# Patient Record
Sex: Female | Born: 1955 | Race: White | Hispanic: No | Marital: Married | Smoking: Current every day smoker
Health system: Southern US, Community
[De-identification: ages and names within clinical notes are randomized; demographics above are authoritative.]

## PROBLEM LIST (undated history)

## (undated) ENCOUNTER — Emergency Department (HOSPITAL_COMMUNITY): Disposition: A | Discharge status: Other Acute Inpatient Hospital | Payer: Self-pay

## (undated) DIAGNOSIS — M542 Cervicalgia: Secondary | ICD-10-CM

## (undated) DIAGNOSIS — J019 Acute sinusitis, unspecified: Secondary | ICD-10-CM

## (undated) DIAGNOSIS — Z789 Other specified health status: Secondary | ICD-10-CM

## (undated) DIAGNOSIS — G4700 Insomnia, unspecified: Secondary | ICD-10-CM

## (undated) DIAGNOSIS — R42 Dizziness and giddiness: Secondary | ICD-10-CM

## (undated) DIAGNOSIS — M545 Low back pain, unspecified: Secondary | ICD-10-CM

## (undated) HISTORY — DX: Dizziness and giddiness: R42

## (undated) HISTORY — DX: Cervicalgia: M54.2

## (undated) HISTORY — DX: Acute sinusitis, unspecified: J01.90

## (undated) HISTORY — DX: Insomnia, unspecified: G47.00

## (undated) HISTORY — DX: Low back pain, unspecified: M54.50

## (undated) HISTORY — DX: Low back pain: M54.5

---

## 1992-07-27 HISTORY — PX: BREAST SURGERY: SHX581

## 1996-07-27 HISTORY — PX: BUNIONETTE EXCISION: SUR500

## 1999-11-22 ENCOUNTER — Emergency Department (HOSPITAL_COMMUNITY): Admission: EM | Admit: 1999-11-22 | Discharge: 1999-11-22 | Payer: Self-pay | Admitting: Emergency Medicine

## 1999-12-17 ENCOUNTER — Other Ambulatory Visit: Admission: RE | Admit: 1999-12-17 | Discharge: 1999-12-17 | Payer: Self-pay | Admitting: Obstetrics & Gynecology

## 2000-12-22 ENCOUNTER — Other Ambulatory Visit: Admission: RE | Admit: 2000-12-22 | Discharge: 2000-12-22 | Payer: Self-pay | Admitting: Obstetrics & Gynecology

## 2002-04-05 ENCOUNTER — Other Ambulatory Visit: Admission: RE | Admit: 2002-04-05 | Discharge: 2002-04-05 | Payer: Self-pay | Admitting: Obstetrics & Gynecology

## 2002-09-08 ENCOUNTER — Ambulatory Visit (HOSPITAL_COMMUNITY): Admission: RE | Admit: 2002-09-08 | Discharge: 2002-09-08 | Payer: Self-pay | Admitting: Obstetrics & Gynecology

## 2003-05-17 ENCOUNTER — Other Ambulatory Visit: Admission: RE | Admit: 2003-05-17 | Discharge: 2003-05-17 | Payer: Self-pay | Admitting: Obstetrics & Gynecology

## 2004-07-03 ENCOUNTER — Other Ambulatory Visit: Admission: RE | Admit: 2004-07-03 | Discharge: 2004-07-03 | Payer: Self-pay | Admitting: Obstetrics & Gynecology

## 2010-03-09 ENCOUNTER — Emergency Department (HOSPITAL_COMMUNITY): Admission: EM | Admit: 2010-03-09 | Discharge: 2010-03-09 | Payer: Self-pay | Admitting: Emergency Medicine

## 2010-12-12 NOTE — Op Note (Signed)
NAME:  Cheyenne Crawford, Cheyenne Crawford                          ACCOUNT NO.:  [id number]   MEDICAL RECORD NO.:  [id number]                   PATIENT TYPE:  AMB   LOCATION:  SDC                                  FACILITY:  [facility]   PHYSICIAN:  Gerrit Friends. Aldona Bar, M.D.                DATE OF BIRTH:  1956-02-25   DATE OF PROCEDURE:  09/08/2002  DATE OF DISCHARGE:                                 OPERATIVE REPORT   PREOPERATIVE DIAGNOSIS:  Desire for permanent elective sterilization.   POSTOPERATIVE DIAGNOSIS:  Desire for permanent elective sterilization.   PROCEDURE:  Laparoscopic tubal coagulation, permanent elective  sterilization.   SURGEON:  Gerrit Friends. Aldona Bar, M.D.   ANESTHESIA:  General endotracheal anesthesia.   HISTORY:  This 55 year old patient is desirous of a permanent elective  sterilization procedure.  She is aware that such procedure is meant to be  100% permanent but, unfortunately, is not 100% perfect as subsequent  pregnancy may result.   According to her wishes, she is now taken to the operating room for such  procedure.   DESCRIPTION OF PROCEDURE:  The patient was taken to the operating room  where, after satisfactory induction of general endotracheal anesthesia, she  was prepped and draped, placed in a modified lithotomy position in the usual  manner for laparoscopy.  Bladder was drained of clear urine with red rubber  catheter in and in and out fashion.   At this time, the patient was adequately draped and the procedure was begun.   A 1 cm subumbilical midline transverse skin incision was made and through  this, the large trocar sleeve was introduced without difficulty.  The trocar  was removed and through the large sleeve, the laparoscope was introduced  with good visualization of  intra-abdominal structures.  At this time, a  pneumoperitoneum was created with approximately three liters of carbon  dioxide gas.  The abdomen and pelvis were inspected.  Liver edges, cervix,  and  diaphragm appeared normal.  Intestinal contents appeared normal.  The  appendix was visualized in its entirety and was normal.  Uterus was small,  normal size, very mobile.  Cul-de-sac was free of any abnormalities and  tubes and ovaries were likewise very normal.   At this time, through a 5 mm suprapubic midline transverse skin incision,  the accessory trocar sleeve was introduced.  Through the accessory sleeve,  once the accessory trocar had been removed, the bipolar coagulating  instrument was introduced after it had been appropriately tested.   At this time the right fallopian tube was identified, traced out to the  fimbriated end for positive identification.  Then the mid portion of the  right fallopian tube and area measuring approximately 2 cm was adequately  coagulated.  Similar procedure was carried out on the left fallopian tube.  At this time, with an adequate portion of each fallopian tube coagulated and  no pathology  appreciated, the procedure was felt to be complete.  The  coagulating instrument was removed from the accessory sleeve and the  accessory sleeve was removed under direct visualization.  The under surface  of the peritoneum was dry.  At this time, the laparoscope was removed and  pneumoperitoneum reduced and large sleeve was removed.  Skin incisions were  closed with 3-0 Vicryl in interrupted subcuticular fashion and sterile  compressive dressing was applied.  The patient, at this time, was awakened  and transported to the recovery room in satisfactory condition, having  tolerated the procedure well.  The estimated blood loss was negligible.  All  counts correct x2.   SPECIMENS:  There were no pathologic specimens.   DISPOSITION:  The patient will be observed in recovery and subsequently  discharged to home with prescriptions for Tylox to use one to two every four  to six hours as needed for severe pain and Compazine rectal suppositories,  25 mg, to use one per  rectum every six to eight hours as needed for nausea  and vomiting.  She will be given specific instructions as to home care and  will be asked to return to the office for follow-up in approximately two to  three weeks' time.                                               Gerrit Friends. Aldona Bar, M.D.    RMW/MEDQ  D:  09/08/2002  T:  09/08/2002  Job:  [id number]

## 2012-08-21 ENCOUNTER — Emergency Department (HOSPITAL_BASED_OUTPATIENT_CLINIC_OR_DEPARTMENT_OTHER)
Admission: EM | Admit: 2012-08-21 | Discharge: 2012-08-21 | Disposition: A | Discharge status: Home / Self Care | Payer: 59 | Attending: Emergency Medicine | Admitting: Emergency Medicine

## 2012-08-21 ENCOUNTER — Emergency Department (HOSPITAL_BASED_OUTPATIENT_CLINIC_OR_DEPARTMENT_OTHER): Payer: 59

## 2012-08-21 ENCOUNTER — Encounter (HOSPITAL_BASED_OUTPATIENT_CLINIC_OR_DEPARTMENT_OTHER): Payer: Self-pay | Admitting: Emergency Medicine

## 2012-08-21 DIAGNOSIS — K219 Gastro-esophageal reflux disease without esophagitis: Secondary | ICD-10-CM

## 2012-08-21 DIAGNOSIS — F17200 Nicotine dependence, unspecified, uncomplicated: Secondary | ICD-10-CM | POA: Insufficient documentation

## 2012-08-21 DIAGNOSIS — R112 Nausea with vomiting, unspecified: Secondary | ICD-10-CM | POA: Insufficient documentation

## 2012-08-21 LAB — COMPREHENSIVE METABOLIC PANEL
Albumin: 3.8 g/dL (ref 3.5–5.2)
BUN: 14 mg/dL (ref 6–23)
Calcium: 9.5 mg/dL (ref 8.4–10.5)
Creatinine, Ser: 0.8 mg/dL (ref 0.50–1.10)
Potassium: 4.5 mEq/L (ref 3.5–5.1)
Total Protein: 7.1 g/dL (ref 6.0–8.3)

## 2012-08-21 LAB — CBC WITH DIFFERENTIAL/PLATELET
Basophils Relative: 0 % (ref 0–1)
Eosinophils Absolute: 0.2 10*3/uL (ref 0.0–0.7)
HCT: 40.8 % (ref 36.0–46.0)
Lymphocytes Relative: 24 % (ref 12–46)
Lymphs Abs: 2.1 10*3/uL (ref 0.7–4.0)
MCV: 89.7 fL (ref 78.0–100.0)
Monocytes Absolute: 0.7 10*3/uL (ref 0.1–1.0)
Monocytes Relative: 8 % (ref 3–12)
Neutro Abs: 5.8 10*3/uL (ref 1.7–7.7)
Platelets: 241 10*3/uL (ref 150–400)
RBC: 4.55 MIL/uL (ref 3.87–5.11)
RDW: 12.3 % (ref 11.5–15.5)
WBC: 8.8 10*3/uL (ref 4.0–10.5)

## 2012-08-21 LAB — LIPASE, BLOOD: Lipase: 33 U/L (ref 11–59)

## 2012-08-21 MED ORDER — ONDANSETRON HCL 4 MG/2ML IJ SOLN
4.0000 mg | Freq: Once | INTRAMUSCULAR | Status: AC
  Administered 2012-08-21: 4 mg via INTRAVENOUS
  Filled 2012-08-21: qty 2

## 2012-08-21 MED ORDER — FENTANYL CITRATE 0.05 MG/ML IJ SOLN
50.0000 ug | Freq: Once | INTRAMUSCULAR | Status: AC
  Administered 2012-08-21: 50 ug via INTRAVENOUS
  Filled 2012-08-21: qty 2

## 2012-08-21 MED ORDER — SUCRALFATE 1 GM/10ML PO SUSP: 1.0000 g | Freq: Four times a day (QID) | ORAL | Status: DC

## 2012-08-21 MED ORDER — GI COCKTAIL ~~LOC~~
30.0000 mL | Freq: Once | ORAL | Status: AC
  Administered 2012-08-21: 30 mL via ORAL
  Filled 2012-08-21: qty 30

## 2012-08-21 NOTE — ED Notes (Signed)
Mid abdominal pain that started around 10pm last pm, pt reports vomiting multiple times, took pepto and ibuprofen with no relief

## 2012-08-21 NOTE — ED Provider Notes (Signed)
History     CSN: [id number]  Arrival date & time 08/21/12  0415   First MD Initiated Contact with Patient 08/21/12 0434      Chief Complaint  Patient presents with  . Abdominal Pain    (Consider location/radiation/quality/duration/timing/severity/associated sxs/prior treatment) Patient is a 57 y.o. female presenting with abdominal pain. The history is provided by the patient. No language interpreter was used.  Abdominal Pain The primary symptoms of the illness include abdominal pain, nausea and vomiting. The primary symptoms of the illness do not include fever or shortness of breath. The current episode started 6 to 12 hours ago. The onset of the illness was sudden. The problem has not changed since onset. The abdominal pain began 6 to 12 hours ago. The pain came on suddenly. The abdominal pain has been unchanged since its onset. The abdominal pain is located in the LUQ and RUQ. The abdominal pain does not radiate. The abdominal pain is relieved by nothing. The abdominal pain is exacerbated by vomiting.  The vomiting began yesterday. Vomiting occurs 2 to 5 times per day.  The patient has not had a change in bowel habit. Symptoms associated with the illness do not include heartburn or urgency. Significant associated medical issues do not include PUD or GERD.    History reviewed. No pertinent past medical history.  History reviewed. No pertinent past surgical history.  History reviewed. No pertinent family history.  History  Substance Use Topics  . Smoking status: Current Every Day Smoker    Types: Cigarettes  . Smokeless tobacco: Not on file  . Alcohol Use: Yes     Comment: occasionally    OB History    Grav Para Term Preterm Abortions TAB SAB Ect Mult Living                  Review of Systems  Constitutional: Negative for fever.  Respiratory: Negative for chest tightness and shortness of breath.   Cardiovascular: Negative for chest pain.  Gastrointestinal: Positive for  nausea, vomiting and abdominal pain. Negative for heartburn.  Genitourinary: Negative for urgency.  All other systems reviewed and are negative.    Allergies  Review of patient's allergies indicates no known allergies.  Home Medications  No current outpatient prescriptions on file.  BP 124/76  Pulse 74  Temp 98.4 F (36.9 C) (Oral)  Resp 18  Ht 5\' 2"  (1.575 m)  Wt 160 lb (72.576 kg)  BMI 29.26 kg/m2  SpO2 100%  Physical Exam  Constitutional: She is oriented to person, place, and time. She appears well-developed and well-nourished. No distress.  HENT:  Head: Normocephalic and atraumatic.  Mouth/Throat: Oropharynx is clear and moist.  Eyes: Conjunctivae normal are normal. Pupils are equal, round, and reactive to light.  Neck: Normal range of motion. Neck supple.  Cardiovascular: Normal rate, regular rhythm and intact distal pulses.   Pulmonary/Chest: Effort normal and breath sounds normal.  Abdominal: Soft. She exhibits distension. Bowel sounds are increased. There is no tenderness. There is no rebound and no guarding.  Musculoskeletal: Normal range of motion.  Neurological: She is alert and oriented to person, place, and time.  Skin: Skin is warm and dry.  Psychiatric: She has a normal mood and affect.    ED Course  Procedures (including critical care time)   Labs Reviewed  CBC WITH DIFFERENTIAL  COMPREHENSIVE METABOLIC PANEL  LIPASE, BLOOD   No results found.   No diagnosis found.    MDM  Symptoms consistent with gas  or gastritis.  No chest pain patient points to above the level of umbilicus.  Return for worsening abdominal pain, pain that localizes        Therisa Mennella K Taytum Wheller-Rasch, MD 08/21/12 [phone]

## 2012-10-12 ENCOUNTER — Other Ambulatory Visit: Payer: Self-pay | Admitting: Physician Assistant

## 2012-10-12 DIAGNOSIS — R112 Nausea with vomiting, unspecified: Secondary | ICD-10-CM

## 2012-10-12 DIAGNOSIS — R109 Unspecified abdominal pain: Secondary | ICD-10-CM

## 2012-10-14 ENCOUNTER — Ambulatory Visit
Admission: RE | Admit: 2012-10-14 | Discharge: 2012-10-14 | Disposition: A | Discharge status: Home / Self Care | Payer: 59 | Source: Ambulatory Visit | Attending: Physician Assistant | Admitting: Physician Assistant

## 2012-10-14 DIAGNOSIS — R109 Unspecified abdominal pain: Secondary | ICD-10-CM

## 2012-10-14 DIAGNOSIS — R112 Nausea with vomiting, unspecified: Secondary | ICD-10-CM

## 2012-10-18 ENCOUNTER — Encounter (INDEPENDENT_AMBULATORY_CARE_PROVIDER_SITE_OTHER): Payer: Self-pay | Admitting: Surgery

## 2012-10-18 ENCOUNTER — Encounter (INDEPENDENT_AMBULATORY_CARE_PROVIDER_SITE_OTHER): Payer: Self-pay

## 2012-10-24 ENCOUNTER — Encounter (INDEPENDENT_AMBULATORY_CARE_PROVIDER_SITE_OTHER): Payer: Self-pay | Admitting: Surgery

## 2012-10-24 ENCOUNTER — Ambulatory Visit (INDEPENDENT_AMBULATORY_CARE_PROVIDER_SITE_OTHER): Payer: 59 | Admitting: Surgery

## 2012-10-24 VITALS — BP 122/74 | HR 96 | Temp 97.3°F | Resp 16 | Ht 62.0 in | Wt 152.0 lb

## 2012-10-24 DIAGNOSIS — K8010 Calculus of gallbladder with chronic cholecystitis without obstruction: Secondary | ICD-10-CM

## 2012-10-24 NOTE — Progress Notes (Signed)
Patient ID: Cheyenne Crawford, female   DOB: 25-Apr-1956, 57 y.o.   MRN: [id number]  Chief Complaint  Patient presents with  . New Evaluation    eval GB w/ stones - sludge    HPI Cheyenne Crawford is a 57 y.o. female.  Referred by Cheyenne Medal, PA for Dr. Warrick Crawford for gallbladder disease HPI [name] in good health who recently had a severe episode of epigastric abdominal pain. She thought she was having a heart attack so she went to the emergency department. She was ruled out for cardiac disease and her symptoms improved. The patient reports a lot of gas bloating over the last couple of years. She frequently gets diarrhea after eating certain types of food. This tends to occur with greasy and spicy foods. Recently she had a viral illness and was seen by her primary care physician. Some blood work was obtained which showed a elevated white blood cell count of 14.6. Her electrolytes were within normal limits but liver function tests were very abnormal with an alkaline phosphatase of 354 AST 344 ALT 371. Bilirubin was normal at 0.9. She was then referred for an ultrasound which showed a thickened gallbladder wall with a negative sonographic Murphy's sign she was noted to have gallbladder sludge or small stones within the gallbladder. She does have some slight fatty infiltration. The patient has been asymptomatic over the last several weeks. She has been avoiding fat in her diet.  Past Medical History  Diagnosis Date  . Acute sinusitis, unspecified   . Dizziness and giddiness   . Insomnia, unspecified   . Lumbago   . Cervicalgia     Past Surgical History  Procedure Laterality Date  . Breast surgery Left     left breast lump    Family History  Problem Relation Age of Onset  . COPD Mother     Social History History  Substance Use Topics  . Smoking status: Current Every Day Smoker    Types: Cigarettes  . Smokeless tobacco: Not on file  . Alcohol Use: 0.6 oz/week     1 Glasses of wine per week     Comment: occasionally    No Known Allergies  Current Outpatient Prescriptions  Medication Sig Dispense Refill  . cyclobenzaprine (FLEXERIL) 5 MG tablet Take 5 mg by mouth 3 (three) times daily as needed for muscle spasms.       No current facility-administered medications for this visit.    Review of Systems Review of Systems  Constitutional: Negative for fever, chills and unexpected weight change.  HENT: Negative for hearing loss, congestion, sore throat, trouble swallowing and voice change.   Eyes: Negative for visual disturbance.  Respiratory: Negative for cough and wheezing.   Cardiovascular: Negative for chest pain, palpitations and leg swelling.  Gastrointestinal: Positive for nausea, abdominal pain, diarrhea and abdominal distention. Negative for vomiting, constipation, blood in stool and anal bleeding.  Genitourinary: Negative for hematuria, vaginal bleeding and difficulty urinating.  Musculoskeletal: Negative for arthralgias.  Skin: Negative for rash and wound.  Neurological: Negative for seizures, syncope and headaches.  Hematological: Negative for adenopathy. Does not bruise/bleed easily.  Psychiatric/Behavioral: Negative for confusion.    Blood pressure 122/74, pulse 96, temperature 97.3 F (36.3 C), temperature source Temporal, resp. rate 16, height 5\' 2"  (1.575 m), weight 152 lb (68.947 kg).  Physical Exam Physical Exam WDWN in NAD HEENT:  EOMI, sclera anicteric Neck:  No masses, no thyromegaly Lungs:  CTA bilaterally; normal  respiratory effort CV:  Regular rate and rhythm; no murmurs Abd:  +bowel sounds, soft, non-tender; no palpable masses Ext:  Well-perfused; no edema Skin:  Warm, dry; no sign of jaundice  Data Reviewed RADIOLOGY REPORT*  Clinical Data: Abdominal pain, nausea and vomiting  COMPLETE ABDOMINAL ULTRASOUND  Comparison: Abdomen plain films of 08/21/2012  Findings:  Gallbladder: The gallbladder is visualized  and there is some  sludge within the gallbladder. Small gallstones cannot be excluded.  The gallbladder wall is diffusely thickened measuring 5 mm, but  there is no pain over the gallbladder with compression. This most  likely represents changes of hypoproteinemia.  Common bile duct: The common bile duct is normal measuring 5.5 mm  in diameter distally.  Liver: The liver is slightly echogenic suggesting mild fatty  infiltration. No focal abnormality is seen.  IVC: Appears normal.  Pancreas: The tail of the pancreas is obscured by bowel gas.  Spleen: The spleen is normal measuring 8.6 cm sagittally.  Right Kidney: No hydronephrosis is seen. The right kidney  measures 10.9 cm sagittally.  Left Kidney: No hydronephrosis is noted. The left kidney measures  10.6 cm.  Abdominal aorta: The abdominal aorta is normal in caliber.  IMPRESSION:  1. Thickened gallbladder wall diffusely most consistent with  hypoproteinemia. Correlate clinically. No pain is present over the  gallbladder with compression. Gallbladder sludge and possible  small stones are noted.  2. Slightly echogenic liver suggests fatty infiltration.  3. The tail of the pancreas is obscured by bowel gas.  Original Report Authenticated By: Dwyane Dee, M.D.   WBC 14.6 Hgb 13.0 Alk Phos 354 ALT 371 AST 344 Hepatitis panel negative Assessment    Chronic cholecystitis - possible choledocholithiasis  Fatty liver    Plan    Recommend laparoscopic cholecystectomy with intraoperative cholangiogram.  The surgical procedure has been discussed with the patient.  Potential risks, benefits, alternative treatments, and expected outcomes have been explained.  All of the patient's questions at this time have been answered.  The likelihood of reaching the patient's treatment goal is good.  The patient understand the proposed surgical procedure and wishes to proceed.         Cheyenne Crawford K. 10/24/2012, 5:03 PM

## 2012-12-14 ENCOUNTER — Encounter (HOSPITAL_BASED_OUTPATIENT_CLINIC_OR_DEPARTMENT_OTHER)
Admission: RE | Admit: 2012-12-14 | Discharge: 2012-12-14 | Disposition: A | Discharge status: Home / Self Care | Payer: 59 | Source: Ambulatory Visit | Attending: Surgery | Admitting: Surgery

## 2012-12-14 ENCOUNTER — Encounter (HOSPITAL_BASED_OUTPATIENT_CLINIC_OR_DEPARTMENT_OTHER): Payer: Self-pay | Admitting: *Deleted

## 2012-12-14 DIAGNOSIS — Z01812 Encounter for preprocedural laboratory examination: Secondary | ICD-10-CM | POA: Insufficient documentation

## 2012-12-14 DIAGNOSIS — K811 Chronic cholecystitis: Secondary | ICD-10-CM | POA: Insufficient documentation

## 2012-12-14 LAB — CBC WITH DIFFERENTIAL/PLATELET
Basophils Relative: 1 % (ref 0–1)
Eosinophils Absolute: 0.3 10*3/uL (ref 0.0–0.7)
HCT: 40.8 % (ref 36.0–46.0)
Hemoglobin: 14 g/dL (ref 12.0–15.0)
Lymphocytes Relative: 41 % (ref 12–46)
MCH: 31 pg (ref 26.0–34.0)
MCHC: 34.3 g/dL (ref 30.0–36.0)
Monocytes Absolute: 0.7 10*3/uL (ref 0.1–1.0)
Monocytes Relative: 10 % (ref 3–12)
Neutro Abs: 3.5 10*3/uL (ref 1.7–7.7)
Neutrophils Relative %: 46 % (ref 43–77)
RBC: 4.51 MIL/uL (ref 3.87–5.11)
RDW: 13.6 % (ref 11.5–15.5)
WBC: 7.7 10*3/uL (ref 4.0–10.5)

## 2012-12-14 LAB — COMPREHENSIVE METABOLIC PANEL
AST: 19 U/L (ref 0–37)
Alkaline Phosphatase: 94 U/L (ref 39–117)
BUN: 11 mg/dL (ref 6–23)
CO2: 25 mEq/L (ref 19–32)
Chloride: 103 mEq/L (ref 96–112)
Creatinine, Ser: 0.73 mg/dL (ref 0.50–1.10)
GFR calc non Af Amer: >90 mL/min (ref >90)
Total Bilirubin: 0.2 mg/dL — ABNORMAL LOW (ref 0.3–1.2)

## 2012-12-14 LAB — LIPASE, BLOOD: Lipase: 41 U/L (ref 11–59)

## 2012-12-14 NOTE — Progress Notes (Signed)
To come in for [facility] labs- 

## 2012-12-22 ENCOUNTER — Encounter (HOSPITAL_BASED_OUTPATIENT_CLINIC_OR_DEPARTMENT_OTHER)
Admission: RE | Disposition: A | Discharge status: Home / Self Care | Payer: Self-pay | Source: Ambulatory Visit | Attending: Surgery

## 2012-12-22 ENCOUNTER — Ambulatory Visit (HOSPITAL_BASED_OUTPATIENT_CLINIC_OR_DEPARTMENT_OTHER)
Admission: RE | Admit: 2012-12-22 | Discharge: 2012-12-22 | Disposition: A | Discharge status: Home / Self Care | Payer: 59 | Source: Ambulatory Visit | Attending: Surgery | Admitting: Surgery

## 2012-12-22 ENCOUNTER — Ambulatory Visit (HOSPITAL_COMMUNITY): Payer: 59

## 2012-12-22 ENCOUNTER — Ambulatory Visit (HOSPITAL_BASED_OUTPATIENT_CLINIC_OR_DEPARTMENT_OTHER): Payer: 59 | Admitting: Anesthesiology

## 2012-12-22 ENCOUNTER — Encounter (HOSPITAL_BASED_OUTPATIENT_CLINIC_OR_DEPARTMENT_OTHER): Payer: Self-pay | Admitting: Anesthesiology

## 2012-12-22 ENCOUNTER — Encounter (HOSPITAL_BASED_OUTPATIENT_CLINIC_OR_DEPARTMENT_OTHER): Payer: Self-pay | Admitting: *Deleted

## 2012-12-22 DIAGNOSIS — K811 Chronic cholecystitis: Secondary | ICD-10-CM | POA: Insufficient documentation

## 2012-12-22 DIAGNOSIS — M542 Cervicalgia: Secondary | ICD-10-CM | POA: Insufficient documentation

## 2012-12-22 DIAGNOSIS — K8010 Calculus of gallbladder with chronic cholecystitis without obstruction: Secondary | ICD-10-CM

## 2012-12-22 DIAGNOSIS — G4700 Insomnia, unspecified: Secondary | ICD-10-CM | POA: Insufficient documentation

## 2012-12-22 DIAGNOSIS — Z79899 Other long term (current) drug therapy: Secondary | ICD-10-CM | POA: Insufficient documentation

## 2012-12-22 DIAGNOSIS — M545 Low back pain, unspecified: Secondary | ICD-10-CM | POA: Insufficient documentation

## 2012-12-22 DIAGNOSIS — J019 Acute sinusitis, unspecified: Secondary | ICD-10-CM | POA: Insufficient documentation

## 2012-12-22 DIAGNOSIS — F17200 Nicotine dependence, unspecified, uncomplicated: Secondary | ICD-10-CM | POA: Insufficient documentation

## 2012-12-22 HISTORY — DX: Other specified health status: Z78.9

## 2012-12-22 HISTORY — PX: CHOLECYSTECTOMY: SHX55

## 2012-12-22 SURGERY — LAPAROSCOPIC CHOLECYSTECTOMY WITH INTRAOPERATIVE CHOLANGIOGRAM
Anesthesia: General | Wound class: Contaminated

## 2012-12-22 MED ORDER — GLYCOPYRROLATE 0.2 MG/ML IJ SOLN
INTRAMUSCULAR | Status: DC | PRN
  Administered 2012-12-22: 0.4 mg via INTRAVENOUS

## 2012-12-22 MED ORDER — DEXAMETHASONE SODIUM PHOSPHATE 4 MG/ML IJ SOLN
INTRAMUSCULAR | Status: DC | PRN
  Administered 2012-12-22: 10 mg via INTRAVENOUS

## 2012-12-22 MED ORDER — ROCURONIUM BROMIDE 100 MG/10ML IV SOLN
INTRAVENOUS | Status: DC | PRN
  Administered 2012-12-22: 30 mg via INTRAVENOUS

## 2012-12-22 MED ORDER — PROPOFOL 10 MG/ML IV BOLUS
INTRAVENOUS | Status: DC | PRN
  Administered 2012-12-22: 120 mg via INTRAVENOUS

## 2012-12-22 MED ORDER — OXYCODONE HCL 5 MG PO TABS: 5.0000 mg | ORAL_TABLET | Freq: Once | ORAL | Status: DC | PRN

## 2012-12-22 MED ORDER — NEOSTIGMINE METHYLSULFATE 1 MG/ML IJ SOLN
INTRAMUSCULAR | Status: DC | PRN
  Administered 2012-12-22: 3 mg via INTRAVENOUS

## 2012-12-22 MED ORDER — ONDANSETRON HCL 4 MG/2ML IJ SOLN
INTRAMUSCULAR | Status: DC | PRN
  Administered 2012-12-22: 4 mg via INTRAVENOUS

## 2012-12-22 MED ORDER — OXYCODONE-ACETAMINOPHEN 5-325 MG PO TABS: 1.0000 | ORAL_TABLET | ORAL | Status: DC | PRN

## 2012-12-22 MED ORDER — SODIUM CHLORIDE 0.9 % IR SOLN
Status: DC | PRN
  Administered 2012-12-22: 1000 mL

## 2012-12-22 MED ORDER — LACTATED RINGERS IV SOLN
INTRAVENOUS | Status: DC
  Administered 2012-12-22 (×3): via INTRAVENOUS

## 2012-12-22 MED ORDER — MIDAZOLAM HCL 2 MG/2ML IJ SOLN: 1.0000 mg | INTRAMUSCULAR | Status: DC | PRN

## 2012-12-22 MED ORDER — FENTANYL CITRATE 0.05 MG/ML IJ SOLN: 50.0000 ug | INTRAMUSCULAR | Status: DC | PRN

## 2012-12-22 MED ORDER — ACETAMINOPHEN 10 MG/ML IV SOLN
1000.0000 mg | Freq: Once | INTRAVENOUS | Status: AC
  Administered 2012-12-22: 1000 mg via INTRAVENOUS

## 2012-12-22 MED ORDER — FENTANYL CITRATE 0.05 MG/ML IJ SOLN
INTRAMUSCULAR | Status: DC | PRN
  Administered 2012-12-22: 100 ug via INTRAVENOUS
  Administered 2012-12-22 (×2): 50 ug via INTRAVENOUS

## 2012-12-22 MED ORDER — OXYCODONE HCL 5 MG/5ML PO SOLN: 5.0000 mg | Freq: Once | ORAL | Status: DC | PRN

## 2012-12-22 MED ORDER — SODIUM CHLORIDE 0.9 % IV SOLN
INTRAVENOUS | Status: DC | PRN
  Administered 2012-12-22: 09:00:00

## 2012-12-22 MED ORDER — HYDROMORPHONE HCL PF 1 MG/ML IJ SOLN
0.2500 mg | INTRAMUSCULAR | Status: DC | PRN
  Administered 2012-12-22 (×3): 0.5 mg via INTRAVENOUS

## 2012-12-22 MED ORDER — CHLORHEXIDINE GLUCONATE 4 % EX LIQD: 1.0000 "application " | Freq: Once | CUTANEOUS | Status: DC

## 2012-12-22 MED ORDER — CEFAZOLIN SODIUM-DEXTROSE 2-3 GM-% IV SOLR
2.0000 g | INTRAVENOUS | Status: AC
  Administered 2012-12-22: 2 g via INTRAVENOUS

## 2012-12-22 MED ORDER — BUPIVACAINE-EPINEPHRINE 0.25% -1:200000 IJ SOLN
INTRAMUSCULAR | Status: DC | PRN
  Administered 2012-12-22: 10 mL

## 2012-12-22 MED ORDER — LIDOCAINE HCL (CARDIAC) 20 MG/ML IV SOLN: INTRAVENOUS | Status: DC | PRN

## 2012-12-22 MED ORDER — ONDANSETRON HCL 4 MG/2ML IJ SOLN: 4.0000 mg | INTRAMUSCULAR | Status: DC | PRN

## 2012-12-22 MED ORDER — MORPHINE SULFATE 2 MG/ML IJ SOLN: 2.0000 mg | INTRAMUSCULAR | Status: DC | PRN

## 2012-12-22 MED ORDER — MIDAZOLAM HCL 5 MG/5ML IJ SOLN
INTRAMUSCULAR | Status: DC | PRN
  Administered 2012-12-22: 2 mg via INTRAVENOUS

## 2012-12-22 MED ORDER — LIDOCAINE HCL (CARDIAC) 20 MG/ML IV SOLN
INTRAVENOUS | Status: DC | PRN
  Administered 2012-12-22: 40 mg via INTRAVENOUS

## 2012-12-22 SURGICAL SUPPLY — 45 items
APL SKNCLS STERI-STRIP NONHPOA (GAUZE/BANDAGES/DRESSINGS) ×1
APPLIER CLIP ROT 10 11.4 M/L (STAPLE) ×2
APR CLP MED LRG 11.4X10 (STAPLE) ×1
BAG SPEC RTRVL LRG 6X4 10 (ENDOMECHANICALS) ×1
BENZOIN TINCTURE PRP APPL 2/3 (GAUZE/BANDAGES/DRESSINGS) ×2 IMPLANT
BLADE SURG ROTATE 9660 (MISCELLANEOUS) IMPLANT
CANISTER SUCTION 2500CC (MISCELLANEOUS) ×2 IMPLANT
CHLORAPREP W/TINT 26ML (MISCELLANEOUS) ×2 IMPLANT
CLIP APPLIE ROT 10 11.4 M/L (STAPLE) ×1 IMPLANT
CLOTH BEACON ORANGE TIMEOUT ST (SAFETY) ×2 IMPLANT
COVER MAYO STAND STRL (DRAPES) ×2 IMPLANT
COVER SURGICAL LIGHT HANDLE (MISCELLANEOUS) ×2 IMPLANT
DECANTER SPIKE VIAL GLASS SM (MISCELLANEOUS) ×2 IMPLANT
DRAPE C-ARM 42X72 X-RAY (DRAPES) ×2 IMPLANT
DRAPE UTILITY XL STRL (DRAPES) ×2 IMPLANT
DRSG TEGADERM 2-3/8X2-3/4 SM (GAUZE/BANDAGES/DRESSINGS) ×6 IMPLANT
DRSG TEGADERM 4X4.75 (GAUZE/BANDAGES/DRESSINGS) ×2 IMPLANT
ELECT REM PT RETURN 9FT ADLT (ELECTROSURGICAL) ×2
ELECTRODE REM PT RTRN 9FT ADLT (ELECTROSURGICAL) ×1 IMPLANT
FILTER SMOKE EVAC LAPAROSHD (FILTER) ×1 IMPLANT
GLOVE BIO SURGEON STRL SZ7 (GLOVE) ×2 IMPLANT
GLOVE BIOGEL PI IND STRL 6.5 (GLOVE) IMPLANT
GLOVE BIOGEL PI IND STRL 7.5 (GLOVE) ×1 IMPLANT
GLOVE BIOGEL PI INDICATOR 6.5 (GLOVE) ×1
GLOVE BIOGEL PI INDICATOR 7.5 (GLOVE) ×2
GLOVE ECLIPSE 6.5 STRL STRAW (GLOVE) ×1 IMPLANT
GLOVE SURG SS PI 7.5 STRL IVOR (GLOVE) ×1 IMPLANT
HEMOSTAT SNOW SURGICEL 2X4 (HEMOSTASIS) ×2 IMPLANT
NS IRRIG 1000ML POUR BTL (IV SOLUTION) IMPLANT
PACK BASIN DAY SURGERY FS (CUSTOM PROCEDURE TRAY) ×2 IMPLANT
PAD ARMBOARD 7.5X6 YLW CONV (MISCELLANEOUS) ×2 IMPLANT
POUCH SPECIMEN RETRIEVAL 10MM (ENDOMECHANICALS) ×2 IMPLANT
SCISSORS LAP 5X35 DISP (ENDOMECHANICALS) IMPLANT
SET CHOLANGIOGRAPH 5 50 .035 (SET/KITS/TRAYS/PACK) ×2 IMPLANT
SET IRRIG TUBING LAPAROSCOPIC (IRRIGATION / IRRIGATOR) ×2 IMPLANT
SLEEVE ENDOPATH XCEL 5M (ENDOMECHANICALS) ×2 IMPLANT
SPECIMEN JAR SMALL (MISCELLANEOUS) ×2 IMPLANT
SPONGE GAUZE 2X2 8PLY STRL LF (GAUZE/BANDAGES/DRESSINGS) ×2 IMPLANT
SUT MNCRL AB 4-0 PS2 18 (SUTURE) ×2 IMPLANT
SUT VICRYL 0 UR6 27IN ABS (SUTURE) IMPLANT
TOWEL OR 17X24 6PK STRL BLUE (TOWEL DISPOSABLE) ×2 IMPLANT
TRAY LAPAROSCOPIC (CUSTOM PROCEDURE TRAY) ×2 IMPLANT
TROCAR XCEL BLUNT TIP 100MML (ENDOMECHANICALS) ×2 IMPLANT
TROCAR XCEL NON-BLD 11X100MML (ENDOMECHANICALS) ×2 IMPLANT
TROCAR XCEL NON-BLD 5MMX100MML (ENDOMECHANICALS) ×2 IMPLANT

## 2012-12-22 NOTE — H&P (Signed)
HPI  Cheyenne Crawford is a 57 y.o. female. Referred by Cheyenne Medal, [place] for Dr. Warrick Crawford for gallbladder disease  HPI  [name] in good health who recently had a severe episode of epigastric abdominal pain. She thought she was having a heart attack so she went to the emergency department. She was ruled out for cardiac disease and her symptoms improved. The patient reports a lot of gas bloating over the last couple of years. She frequently gets diarrhea after eating certain types of food. This tends to occur with greasy and spicy foods. Recently she had a viral illness and was seen by her primary care physician. Some blood work was obtained which showed a elevated white blood cell count of 14.6. Her electrolytes were within normal limits but liver function tests were very abnormal with an alkaline phosphatase of 354 AST 344 ALT 371. Bilirubin was normal at 0.9. She was then referred for an ultrasound which showed a thickened gallbladder wall with a negative sonographic Murphy's sign she was noted to have gallbladder sludge or small stones within the gallbladder. She does have some slight fatty infiltration. The patient has been asymptomatic over the last several weeks. She has been avoiding fat in her diet.  Past Medical History   Diagnosis  Date   .  Acute sinusitis, unspecified    .  Dizziness and giddiness    .  Insomnia, unspecified    .  Lumbago    .  Cervicalgia     Past Surgical History   Procedure  Laterality  Date   .  Breast surgery  Left      left breast lump    Family History   Problem  Relation  Age of Onset   .  COPD  Mother    Social History  History   Substance Use Topics   .  Smoking status:  Current Every Day Smoker     Types:  Cigarettes   .  Smokeless tobacco:  Not on file   .  Alcohol Use:  0.6 oz/week     1 Glasses of wine per week      Comment: occasionally   No Known Allergies  Current Outpatient Prescriptions   Medication  Sig   Dispense  Refill   .  cyclobenzaprine (FLEXERIL) 5 MG tablet  Take 5 mg by mouth 3 (three) times daily as needed for muscle spasms.      No current facility-administered medications for this visit.   Review of Systems  Review of Systems  Constitutional: Negative for fever, chills and unexpected weight change.  HENT: Negative for hearing loss, congestion, sore throat, trouble swallowing and voice change.  Eyes: Negative for visual disturbance.  Respiratory: Negative for cough and wheezing.  Cardiovascular: Negative for chest pain, palpitations and leg swelling.  Gastrointestinal: Positive for nausea, abdominal pain, diarrhea and abdominal distention. Negative for vomiting, constipation, blood in stool and anal bleeding.  Genitourinary: Negative for hematuria, vaginal bleeding and difficulty urinating.  Musculoskeletal: Negative for arthralgias.  Skin: Negative for rash and wound.  Neurological: Negative for seizures, syncope and headaches.  Hematological: Negative for adenopathy. Does not bruise/bleed easily.  Psychiatric/Behavioral: Negative for confusion.  Blood pressure 122/74, pulse 96, temperature 97.3 F (36.3 C), temperature source Temporal, resp. rate 16, height 5\' 2"  (1.575 m), weight 152 lb (68.947 kg).  Physical Exam  Physical Exam  WDWN in NAD  HEENT: EOMI, sclera anicteric  Neck: No masses, no thyromegaly  Lungs: CTA bilaterally; normal respiratory effort  CV: Regular rate and rhythm; no murmurs  Abd: +bowel sounds, soft, non-tender; no palpable masses  Ext: Well-perfused; no edema  Skin: Warm, dry; no sign of jaundice  Data Reviewed  RADIOLOGY REPORT*  Clinical Data: Abdominal pain, nausea and vomiting  COMPLETE ABDOMINAL ULTRASOUND  Comparison: Abdomen plain films of 08/21/2012  Findings:  Gallbladder: The gallbladder is visualized and there is some  sludge within the gallbladder. Small gallstones cannot be excluded.  The gallbladder wall is diffusely thickened  measuring 5 mm, but  there is no pain over the gallbladder with compression. This most  likely represents changes of hypoproteinemia.  Common bile duct: The common bile duct is normal measuring 5.5 mm  in diameter distally.  Liver: The liver is slightly echogenic suggesting mild fatty  infiltration. No focal abnormality is seen.  IVC: Appears normal.  Pancreas: The tail of the pancreas is obscured by bowel gas.  Spleen: The spleen is normal measuring 8.6 cm sagittally.  Right Kidney: No hydronephrosis is seen. The right kidney  measures 10.9 cm sagittally.  Left Kidney: No hydronephrosis is noted. The left kidney measures  10.6 cm.  Abdominal aorta: The abdominal aorta is normal in caliber.  IMPRESSION:  1. Thickened gallbladder wall diffusely most consistent with  hypoproteinemia. Correlate clinically. No pain is present over the  gallbladder with compression. Gallbladder sludge and possible  small stones are noted.  2. Slightly echogenic liver suggests fatty infiltration.  3. The tail of the pancreas is obscured by bowel gas.  Original Report Authenticated By: Dwyane Dee, M.D.  WBC 14.6  Hgb 13.0  Alk Phos 354  ALT 371  AST 344  Hepatitis panel negative  Assessment  Chronic cholecystitis - possible choledocholithiasis  Fatty liver  Plan  Recommend laparoscopic cholecystectomy with intraoperative cholangiogram. The surgical procedure has been discussed with the patient. Potential risks, benefits, alternative treatments, and expected outcomes have been explained. All of the patient's questions at this time have been answered. The likelihood of reaching the patient's treatment goal is good. The patient understand the proposed surgical procedure and wishes to proceed.    Cheyenne Crawford. Cheyenne Skains, MD, [facility] Surgery  General/ Trauma Surgery  12/22/2012 8:11 AM

## 2012-12-22 NOTE — Anesthesia Preprocedure Evaluation (Signed)
Anesthesia Evaluation  Patient identified by MRN, date of birth, ID band Patient awake    Reviewed: Allergy & Precautions, H&P , NPO status , Patient's Chart, lab work & pertinent test results  Airway Mallampati: II TM Distance: >3 FB Neck ROM: Full    Dental no notable dental hx. (+) Teeth Intact and Dental Advisory Given   Pulmonary Current Smoker,  breath sounds clear to auscultation  Pulmonary exam normal       Cardiovascular negative cardio ROS  Rhythm:Regular Rate:Normal     Neuro/Psych negative neurological ROS  negative psych ROS   GI/Hepatic negative GI ROS, Neg liver ROS,   Endo/Other  negative endocrine ROS  Renal/GU negative Renal ROS  negative genitourinary   Musculoskeletal   Abdominal   Peds  Hematology negative hematology ROS (+)   Anesthesia Other Findings   Reproductive/Obstetrics negative OB ROS                           Anesthesia Physical Anesthesia Plan  ASA: II  Anesthesia Plan: General   Post-op Pain Management:    Induction: Intravenous  Airway Management Planned: Oral ETT  Additional Equipment:   Intra-op Plan:   Post-operative Plan: Extubation in OR  Informed Consent: I have reviewed the patients History and Physical, chart, labs and discussed the procedure including the risks, benefits and alternatives for the proposed anesthesia with the patient or authorized representative who has indicated his/her understanding and acceptance.   Dental advisory given  Plan Discussed with: CRNA  Anesthesia Plan Comments:         Anesthesia Quick Evaluation

## 2012-12-22 NOTE — Anesthesia Procedure Notes (Signed)
Procedure Name: Intubation Date/Time: 12/22/2012 8:41 AM Performed by: Gar Gibbon Pre-anesthesia Checklist: Patient identified, Emergency Drugs available, Suction available and Patient being monitored Patient Re-evaluated:Patient Re-evaluated prior to inductionOxygen Delivery Method: Circle System Utilized Preoxygenation: Pre-oxygenation with 100% oxygen Intubation Type: IV induction Ventilation: Mask ventilation without difficulty Laryngoscope Size: Miller and 2 Grade View: Grade II Tube type: Oral Tube size: 7.0 mm Number of attempts: 1 Airway Equipment and Method: stylet and oral airway Placement Confirmation: ETT inserted through vocal cords under direct vision,  positive ETCO2 and breath sounds checked- equal and bilateral Secured at: 20 cm Tube secured with: Tape Dental Injury: Teeth and Oropharynx as per pre-operative assessment

## 2012-12-22 NOTE — Op Note (Signed)
Laparoscopic Cholecystectomy with IOC Procedure Note  Indications: This patient presents with symptomatic gallbladder disease and will undergo laparoscopic cholecystectomy.  Pre-operative Diagnosis: Calculus of gallbladder with other cholecystitis, without mention of obstruction  Post-operative Diagnosis: Same  Surgeon: Nazifa Trinka K.   Assistants: none  Anesthesia: General endotracheal anesthesia  ASA Class: 2  Procedure Details  The patient was seen again in the Holding Room. The risks, benefits, complications, treatment options, and expected outcomes were discussed with the patient. The possibilities of reaction to medication, pulmonary aspiration, perforation of viscus, bleeding, recurrent infection, finding a normal gallbladder, the need for additional procedures, failure to diagnose a condition, the possible need to convert to an open procedure, and creating a complication requiring transfusion or operation were discussed with the patient. The likelihood of improving the patient's symptoms with return to their baseline status is good.  The patient and/or family concurred with the proposed plan, giving informed consent. The site of surgery properly noted. The patient was taken to Operating Room, identified as Cheyenne Crawford and the procedure verified as Laparoscopic Cholecystectomy with Intraoperative Cholangiogram. A Time Out was held and the above information confirmed.  Prior to the induction of general anesthesia, antibiotic prophylaxis was administered. General endotracheal anesthesia was then administered and tolerated well. After the induction, the abdomen was prepped with Chloraprep and draped in the sterile fashion. The patient was positioned in the supine position.  Local anesthetic agent was injected into the skin near the umbilicus and an incision made. We dissected down to the abdominal fascia with blunt dissection.  The fascia was incised vertically and we entered the  peritoneal cavity bluntly.  A pursestring suture of 0-Vicryl was placed around the fascial opening.  The Hasson cannula was inserted and secured with the stay suture.  Pneumoperitoneum was then created with CO2 and tolerated well without any adverse changes in the patient's vital signs. An 11-mm port was placed in the subxiphoid position.  Two 5-mm ports were placed in the right upper quadrant. All skin incisions were infiltrated with a local anesthetic agent before making the incision and placing the trocars.   We positioned the patient in reverse Trendelenburg, tilted slightly to the patient's left.  The gallbladder was identified, the fundus grasped and retracted cephalad. Adhesions were lysed bluntly and with the electrocautery where indicated, taking care not to injure any adjacent organs or viscus. The infundibulum was grasped and retracted laterally, exposing the peritoneum overlying the triangle of Calot. This was then divided and exposed in a blunt fashion. A critical view of the cystic duct and cystic artery was obtained.  The cystic duct was clearly identified and bluntly dissected circumferentially. The cystic duct was ligated with a clip distally.   An incision was made in the cystic duct, some thick bile was visualized and the [facility] cholangiogram catheter introduced. The catheter was secured using a clip.We attempted a cholangiogram, but the catheter seemed to leak around the cystic duct.  I replaced the clips on the cystic duct and attempted the cholangiogram again, but again, it seemed to leak.  The anatomy of the cystic duct and cystic artery were very clear to me, so I chose not to attempt the cholangiogram again.  The catheter was removed. The cystic duct was then ligated with clips and divided. The cystic artery was identified, dissected free, ligated with clips and divided as well.   The gallbladder was dissected from the liver bed in retrograde fashion with the electrocautery. The  gallbladder was removed  and placed in an Endocatch sac. The liver bed was irrigated and inspected. Hemostasis was achieved with the electrocautery. Copious irrigation was utilized and was repeatedly aspirated until clear.  The gallbladder and Endocatch sac were then removed through the umbilical port site.  The pursestring suture was used to close the umbilical fascia.    We again inspected the right upper quadrant for hemostasis.  Pneumoperitoneum was released as we removed the trocars.  4-0 Monocryl was used to close the skin.   Benzoin, steri-strips, and clean dressings were applied. The patient was then extubated and brought to the recovery room in stable condition. Instrument, sponge, and needle counts were correct at closure and at the conclusion of the case.   Findings: Cholecystitis with Cholelithiasis  Estimated Blood Loss: Minimal         Drains: none         Specimens: Gallbladder           Complications: None; patient tolerated the procedure well.         Disposition: PACU - hemodynamically stable.         Condition: stable  Cheyenne Arms. Corliss Skains, MD, [facility] Surgery  General/ Trauma Surgery  12/22/2012 9:52 AM

## 2012-12-22 NOTE — Transfer of Care (Signed)
Immediate Anesthesia Transfer of Care Note  Patient: Cheyenne Crawford  Procedure(s) Performed: Procedure(s): LAPAROSCOPIC CHOLECYSTECTOMY WITH INTRAOPERATIVE CHOLANGIOGRAM (N/A)  Patient Location: PACU  Anesthesia Type:General  Level of Consciousness: sedated  Airway & Oxygen Therapy: Patient Spontanous Breathing and Patient connected to face mask oxygen  Post-op Assessment: Report given to PACU RN and Post -op Vital signs reviewed and stable  Post vital signs: Reviewed and stable  Complications: No apparent anesthesia complications

## 2012-12-22 NOTE — Anesthesia Postprocedure Evaluation (Signed)
  Anesthesia Post-op Note  Patient: Cheyenne Crawford  Procedure(s) Performed: Procedure(s): LAPAROSCOPIC CHOLECYSTECTOMY WITH INTRAOPERATIVE CHOLANGIOGRAM (N/A)  Patient Location: PACU  Anesthesia Type:General  Level of Consciousness: awake, alert  and oriented  Airway and Oxygen Therapy: Patient Spontanous Breathing  Post-op Pain: mild  Post-op Assessment: Post-op Vital signs reviewed, Patient's Cardiovascular Status Stable, Respiratory Function Stable, Patent Airway and No signs of Nausea or vomiting  Post-op Vital Signs: Reviewed and stable  Complications: No apparent anesthesia complications

## 2012-12-23 ENCOUNTER — Encounter (HOSPITAL_BASED_OUTPATIENT_CLINIC_OR_DEPARTMENT_OTHER): Payer: Self-pay | Admitting: Surgery

## 2012-12-26 ENCOUNTER — Telehealth (INDEPENDENT_AMBULATORY_CARE_PROVIDER_SITE_OTHER): Payer: Self-pay | Admitting: General Surgery

## 2012-12-26 NOTE — Telephone Encounter (Signed)
Spoke with patient she is aware of appt on 01/10/13

## 2013-01-04 ENCOUNTER — Telehealth (INDEPENDENT_AMBULATORY_CARE_PROVIDER_SITE_OTHER): Payer: Self-pay | Admitting: *Deleted

## 2013-01-04 NOTE — Telephone Encounter (Signed)
Patient called to state that she is having some acid reflux as she is changing her diet. Encouraged patient to use an OTC antacid to help with the "heart burn".  Patient comes to see Tsuei MD next Tuesday so will start trying this today and update him on Tuesday as it if the OTC medication is helping.  Patient states understanding and agreeable with POC.

## 2013-01-10 ENCOUNTER — Ambulatory Visit (INDEPENDENT_AMBULATORY_CARE_PROVIDER_SITE_OTHER): Payer: 59 | Admitting: Surgery

## 2013-01-10 ENCOUNTER — Encounter (INDEPENDENT_AMBULATORY_CARE_PROVIDER_SITE_OTHER): Payer: Self-pay | Admitting: Surgery

## 2013-01-10 DIAGNOSIS — K8010 Calculus of gallbladder with chronic cholecystitis without obstruction: Secondary | ICD-10-CM

## 2013-01-10 NOTE — Progress Notes (Signed)
Status post left laparoscopic cholecystectomy 12/22/12. Her pathology showed chronic calculus cholecystitis.  She is doing quite well. Her incisions are all healed no sign of infection. No abdominal tenderness. She did have one episode of diarrhea after eating a hot dog with chili. Otherwise her bowel movements have been normal. She may resume full activity. Followup as needed.  Wilmon Arms. Corliss Skains, MD, [facility] Surgery  General/ Trauma Surgery  01/10/2013 11:20 AM

## 2013-04-25 ENCOUNTER — Encounter (INDEPENDENT_AMBULATORY_CARE_PROVIDER_SITE_OTHER): Payer: Self-pay

## 2013-07-03 ENCOUNTER — Other Ambulatory Visit: Payer: Self-pay | Admitting: Obstetrics & Gynecology

## 2013-08-05 IMAGING — US US ABDOMEN COMPLETE
1 series · 13 of 25 positions shown · non-contrast
Comparison: Abdomen plain films of 08/21/2012

CLINICAL DATA: Abdominal pain, nausea and vomiting

COMPLETE ABDOMINAL ULTRASOUND

[Series 1: us abdomen complete · 0.37mm/px · 13 of 85 slices shown]
[im 1/85]
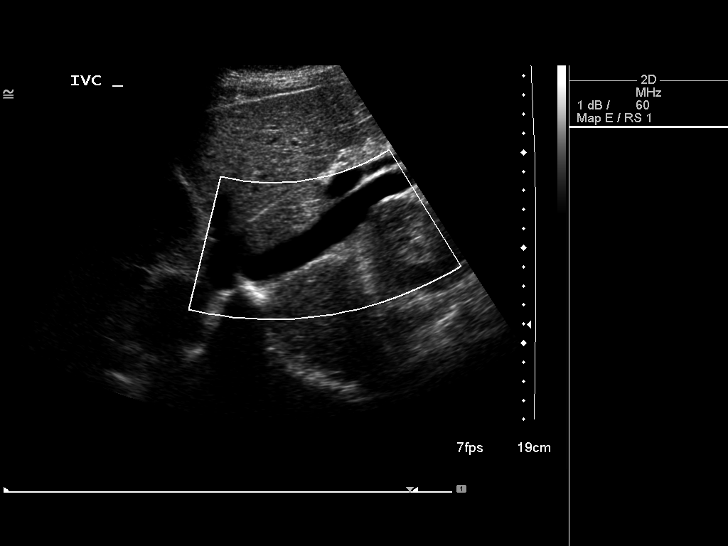
[im 8/85]
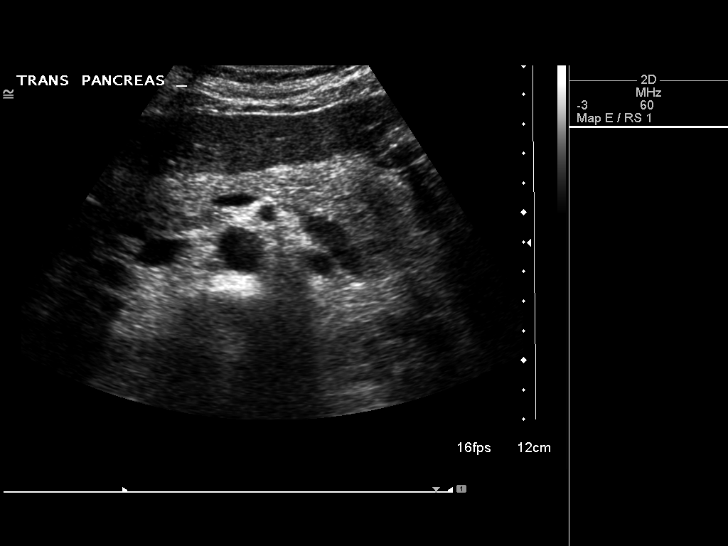
[im 15/85]
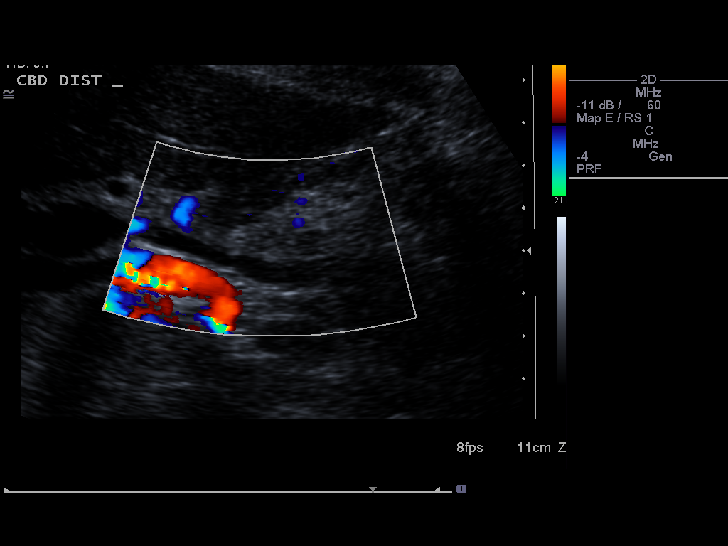
[im 22/85]
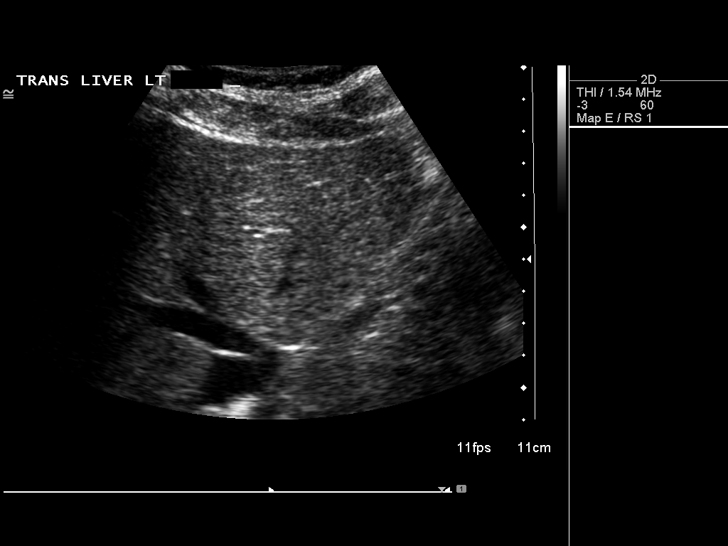
[im 29/85]
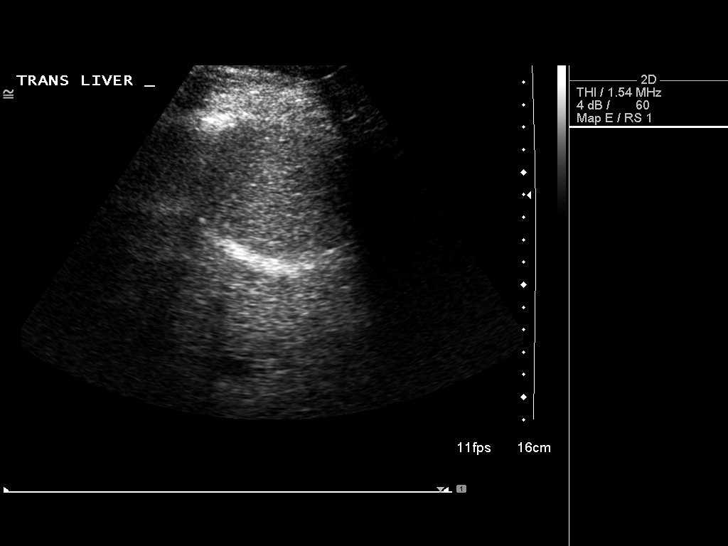
[im 36/85]
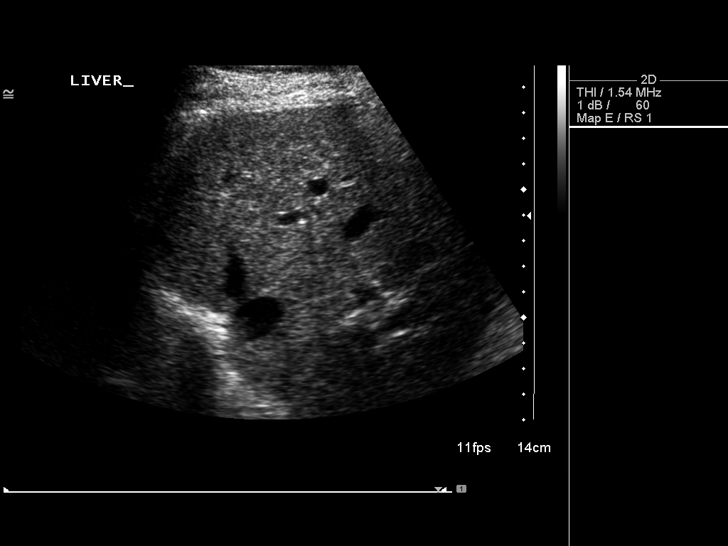
[im 43/85]
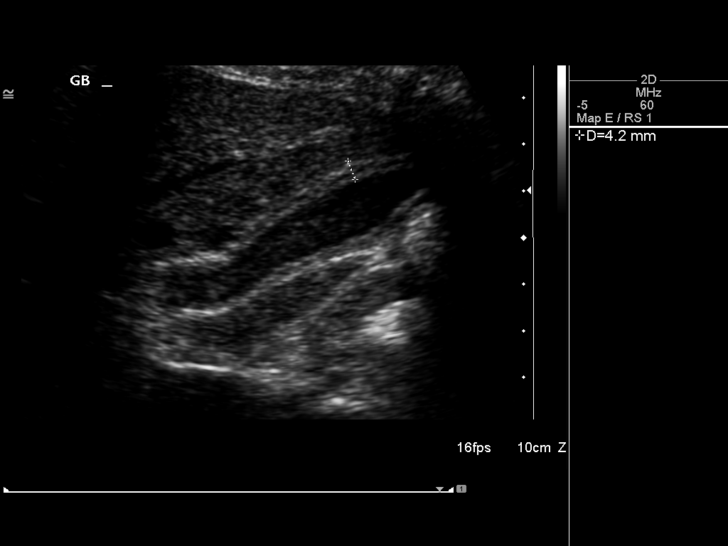
[im 50/85]
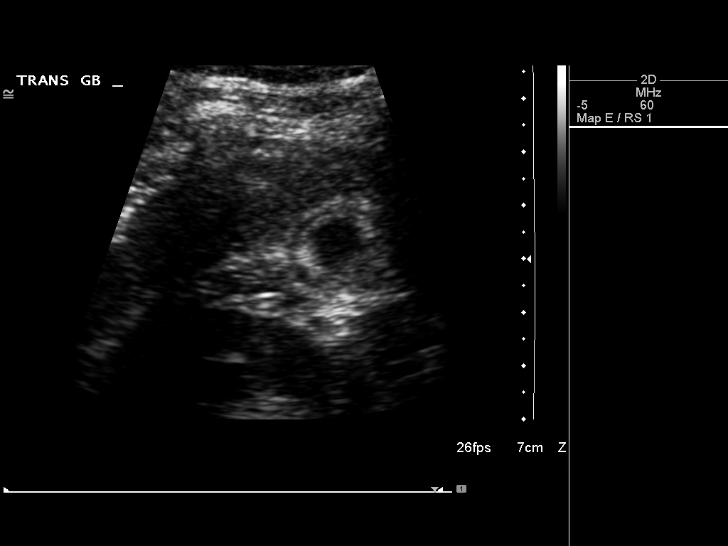
[im 57/85]
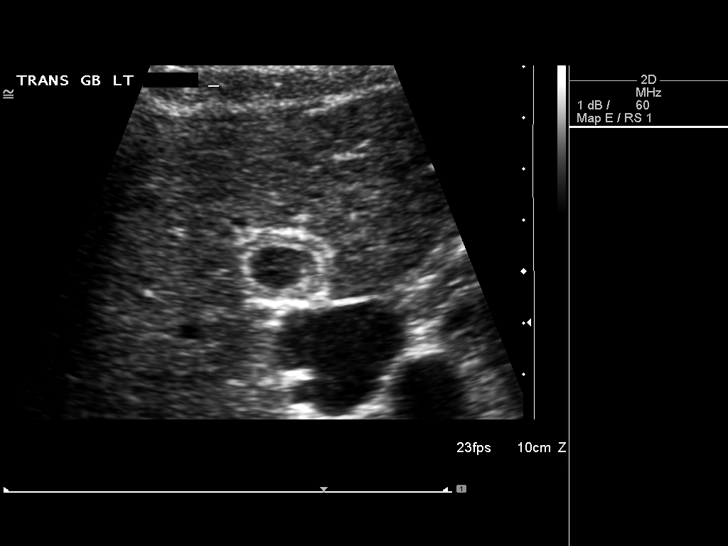
[im 64/85]
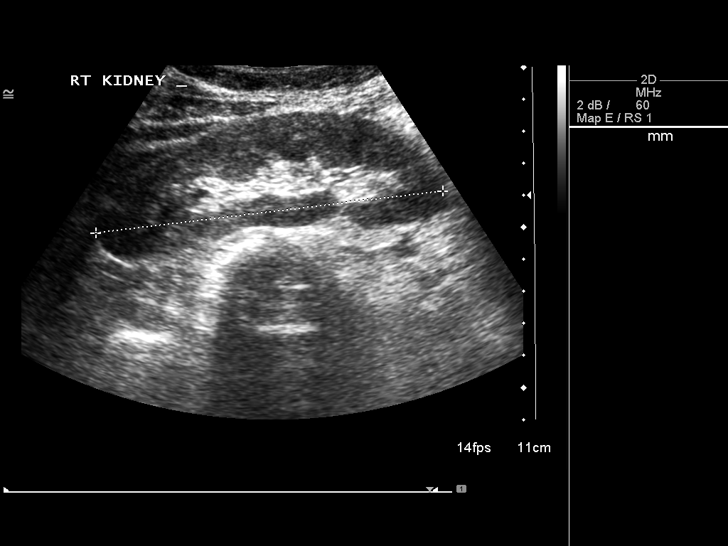
[im 71/85]
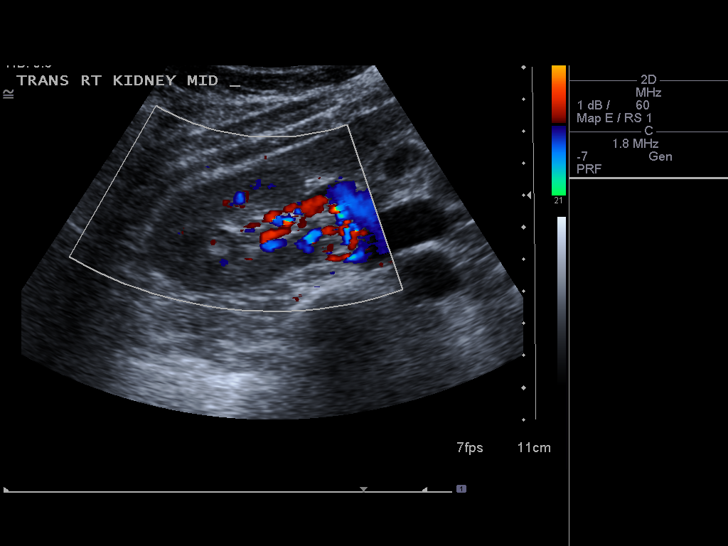
[im 78/85]
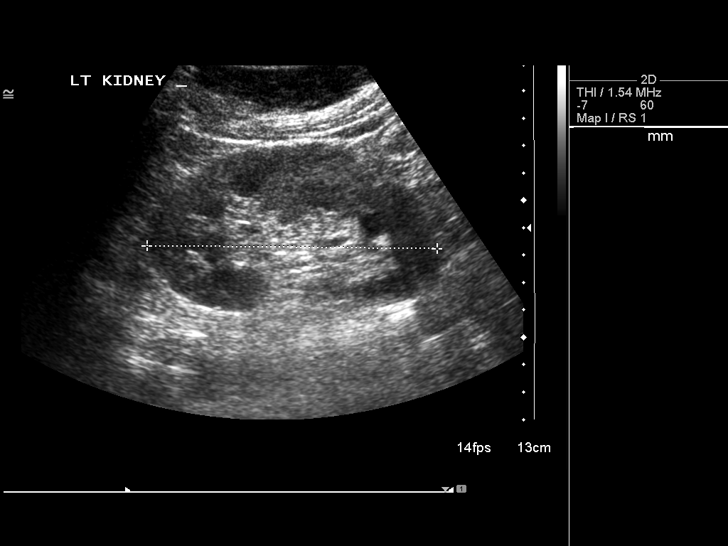
[im 85/85]
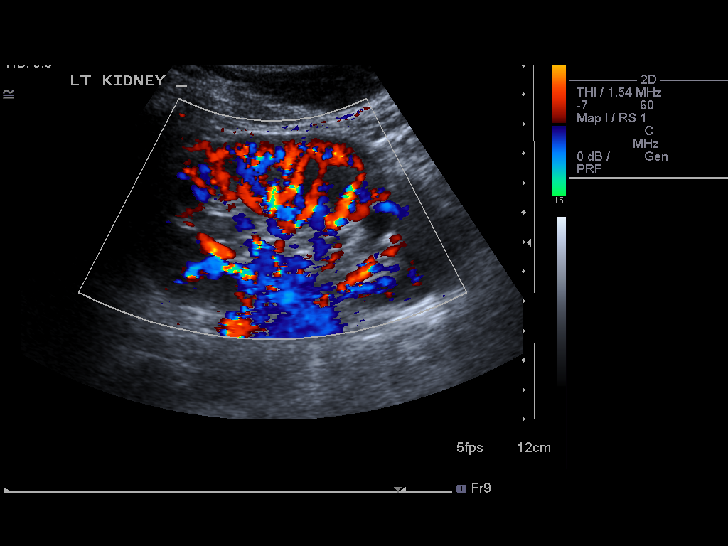

[13 of 25 positions shown; findings below may reference images not displayed]

FINDINGS: Gallbladder:  The gallbladder is visualized and there is some
sludge within the gallbladder. Small gallstones cannot be excluded.
The gallbladder wall is diffusely thickened measuring 5 mm, but
there is no pain over the gallbladder with compression.  This most
likely represents changes of hypoproteinemia.

Common bile duct:  The common bile duct is normal measuring 5.5 mm
in diameter distally.

Liver:  The liver is slightly echogenic suggesting mild fatty
infiltration.  No focal abnormality is seen.

IVC:  Appears normal.

Pancreas:  The tail of the pancreas is obscured by bowel gas.

Spleen:  The spleen is normal measuring 8.6 cm sagittally.

Right Kidney:  No hydronephrosis is seen.  The right kidney
measures 10.9 cm sagittally.

Left Kidney:  No hydronephrosis is noted.  The left kidney measures
10.6 cm.

Abdominal aorta:  The abdominal aorta is normal in caliber.
IMPRESSION: 1.  Thickened gallbladder wall diffusely most consistent with
hypoproteinemia. Correlate clinically.  No pain is present over the
gallbladder with compression.  Gallbladder sludge and possible
small stones are noted.
2.  Slightly echogenic liver suggests fatty infiltration.
3.  The tail of the pancreas is obscured by bowel gas.

## 2014-09-03 ENCOUNTER — Other Ambulatory Visit: Payer: Self-pay | Admitting: Obstetrics & Gynecology

## 2014-09-04 LAB — CYTOLOGY - PAP

## 2016-10-20 ENCOUNTER — Other Ambulatory Visit: Payer: Self-pay | Admitting: Obstetrics & Gynecology

## 2016-10-22 LAB — CYTOLOGY - PAP

## 2019-05-30 ENCOUNTER — Ambulatory Visit
Admission: RE | Admit: 2019-05-30 | Discharge: 2019-05-30 | Disposition: A | Discharge status: Home / Self Care | Payer: 59 | Source: Ambulatory Visit | Attending: Family Medicine | Admitting: Family Medicine

## 2019-05-30 ENCOUNTER — Other Ambulatory Visit: Payer: Self-pay | Admitting: Family Medicine

## 2019-05-30 ENCOUNTER — Other Ambulatory Visit: Payer: Self-pay

## 2019-05-30 DIAGNOSIS — M542 Cervicalgia: Secondary | ICD-10-CM

## 2019-10-20 ENCOUNTER — Ambulatory Visit: Payer: 59 | Attending: Internal Medicine

## 2019-10-20 DIAGNOSIS — Z23 Encounter for immunization: Secondary | ICD-10-CM

## 2019-10-20 NOTE — Progress Notes (Signed)
   Covid-19 Vaccination Clinic  Name:  Cheyenne Crawford    MRN: [id number] DOB: 12-31-1955  10/20/2019  [name] was observed post Covid-19 immunization for 15 minutes without incident. She was provided with Vaccine Information Sheet and instruction to access the V-Safe system.   [name] was instructed to call 911 with any severe reactions post vaccine: Marland Kitchen Difficulty breathing  . Swelling of face and throat  . A fast heartbeat  . A bad rash all over body  . Dizziness and weakness   Immunizations Administered    Name Date Dose VIS Date Route   Moderna COVID-19 Vaccine 10/20/2019 10:59 AM 0.5 mL 06/27/2019 Intramuscular   Manufacturer: Moderna   Lot: [id number]   NDC: 91791-505-69

## 2019-11-21 ENCOUNTER — Ambulatory Visit: Payer: 59

## 2019-11-29 ENCOUNTER — Ambulatory Visit: Payer: 59 | Attending: Internal Medicine

## 2019-11-29 DIAGNOSIS — Z23 Encounter for immunization: Secondary | ICD-10-CM

## 2019-11-29 NOTE — Progress Notes (Signed)
   Covid-19 Vaccination Clinic  Name:  Cheyenne Crawford    MRN: [id number] DOB: [birth year]  11/29/2019  [name] was observed post Covid-19 immunization for 15 minutes without incident. She was provided with Vaccine Information Sheet and instruction to access the V-Safe system.   [name] was instructed to call 911 with any severe reactions post vaccine: Marland Kitchen Difficulty breathing  . Swelling of face and throat  . A fast heartbeat  . A bad rash all over body  . Dizziness and weakness   Immunizations Administered    Name Date Dose VIS Date Route   Moderna COVID-19 Vaccine 11/29/2019 10:34 AM 0.5 mL 06/2019 Intramuscular   Manufacturer: Moderna   Lot: [id number]   NDC: 11216-244-69

## 2020-04-25 ENCOUNTER — Other Ambulatory Visit: Payer: 59

## 2020-04-25 ENCOUNTER — Other Ambulatory Visit: Payer: Self-pay

## 2020-04-25 DIAGNOSIS — Z20822 Contact with and (suspected) exposure to covid-19: Secondary | ICD-10-CM

## 2020-04-26 LAB — NOVEL CORONAVIRUS, NAA: SARS-CoV-2, NAA: NOT DETECTED

## 2020-04-26 LAB — SPECIMEN STATUS REPORT

## 2020-04-26 LAB — SARS-COV-2, NAA 2 DAY TAT

## 2020-08-07 ENCOUNTER — Other Ambulatory Visit: Payer: Self-pay | Admitting: Family Medicine

## 2020-08-07 ENCOUNTER — Ambulatory Visit
Admission: RE | Admit: 2020-08-07 | Discharge: 2020-08-07 | Disposition: A | Discharge status: Home / Self Care | Payer: 59 | Source: Ambulatory Visit | Attending: Family Medicine | Admitting: Family Medicine

## 2020-08-07 ENCOUNTER — Other Ambulatory Visit: Payer: Self-pay

## 2020-08-07 DIAGNOSIS — M25512 Pain in left shoulder: Secondary | ICD-10-CM

## 2021-05-29 IMAGING — CR DG SHOULDER 2+V*L*
3 series · 3 of 3 positions shown · non-contrast
Comparison: None.

CLINICAL DATA: Shoulder pain x1 month which occasionally extends
down the arm.

EXAM:
LEFT SHOULDER - 2+ VIEW

[w shoulder grashey left]
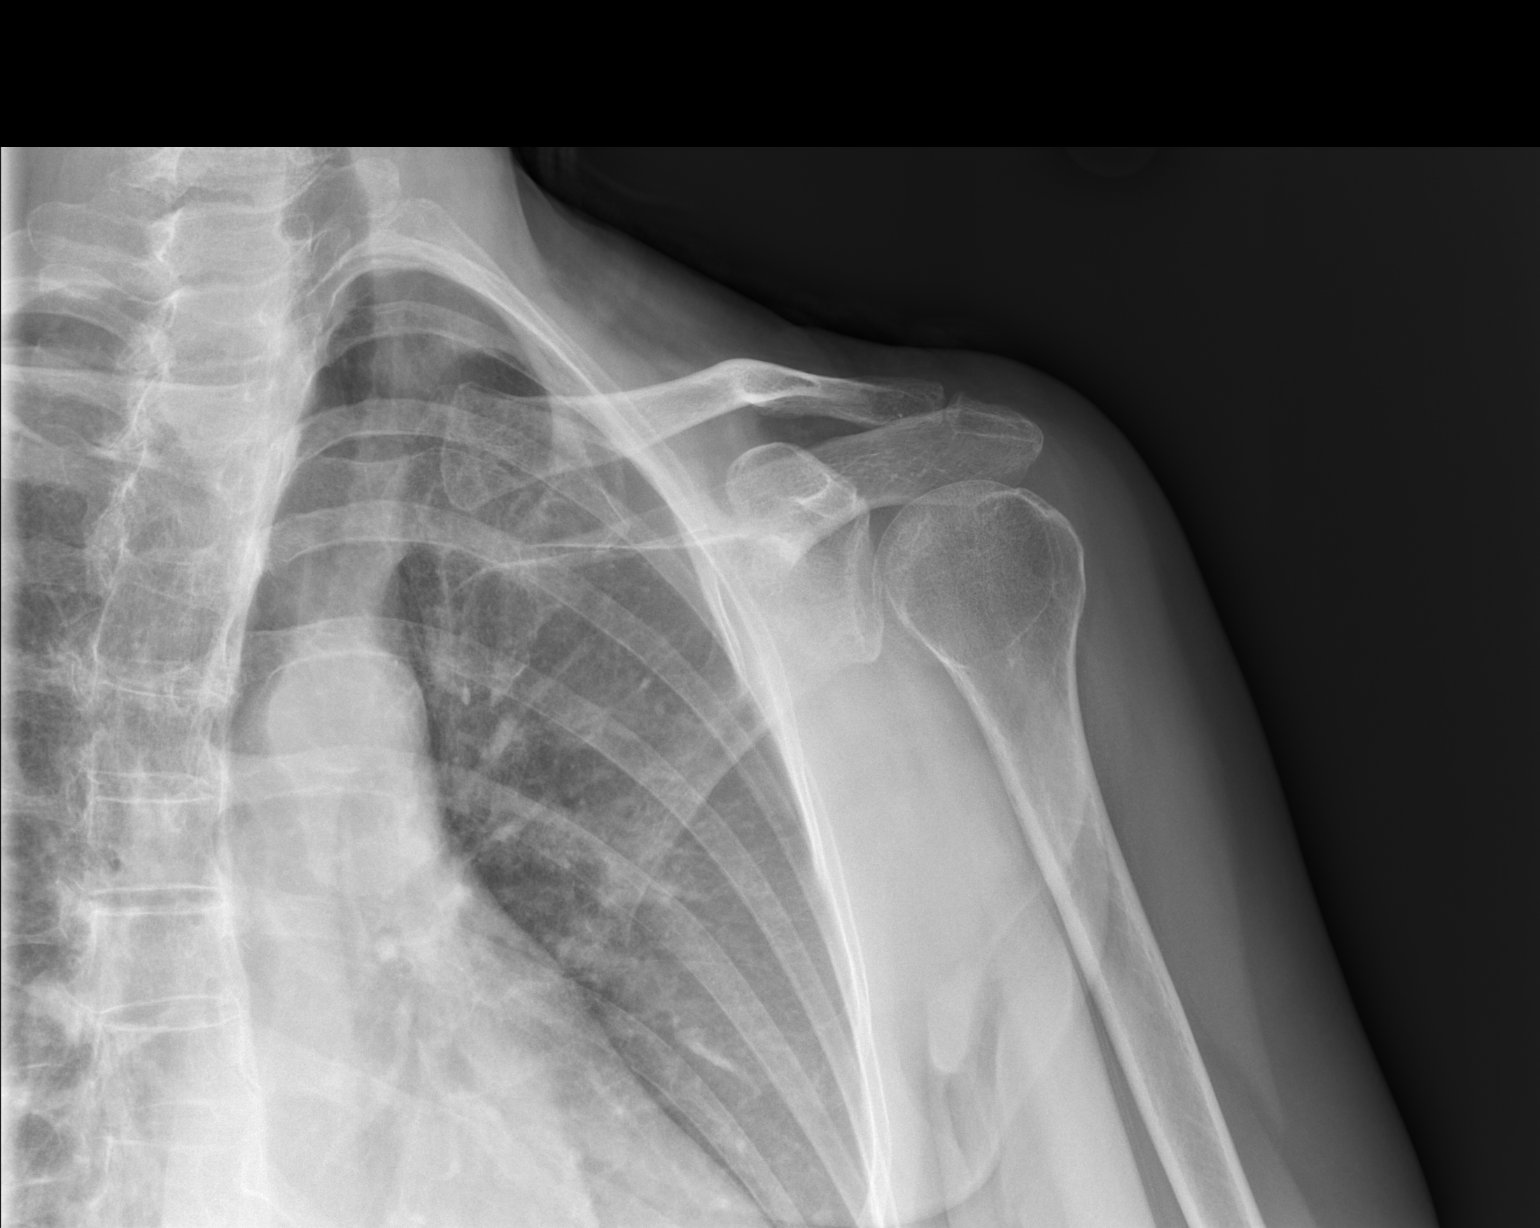

[w shoulder y-view left]
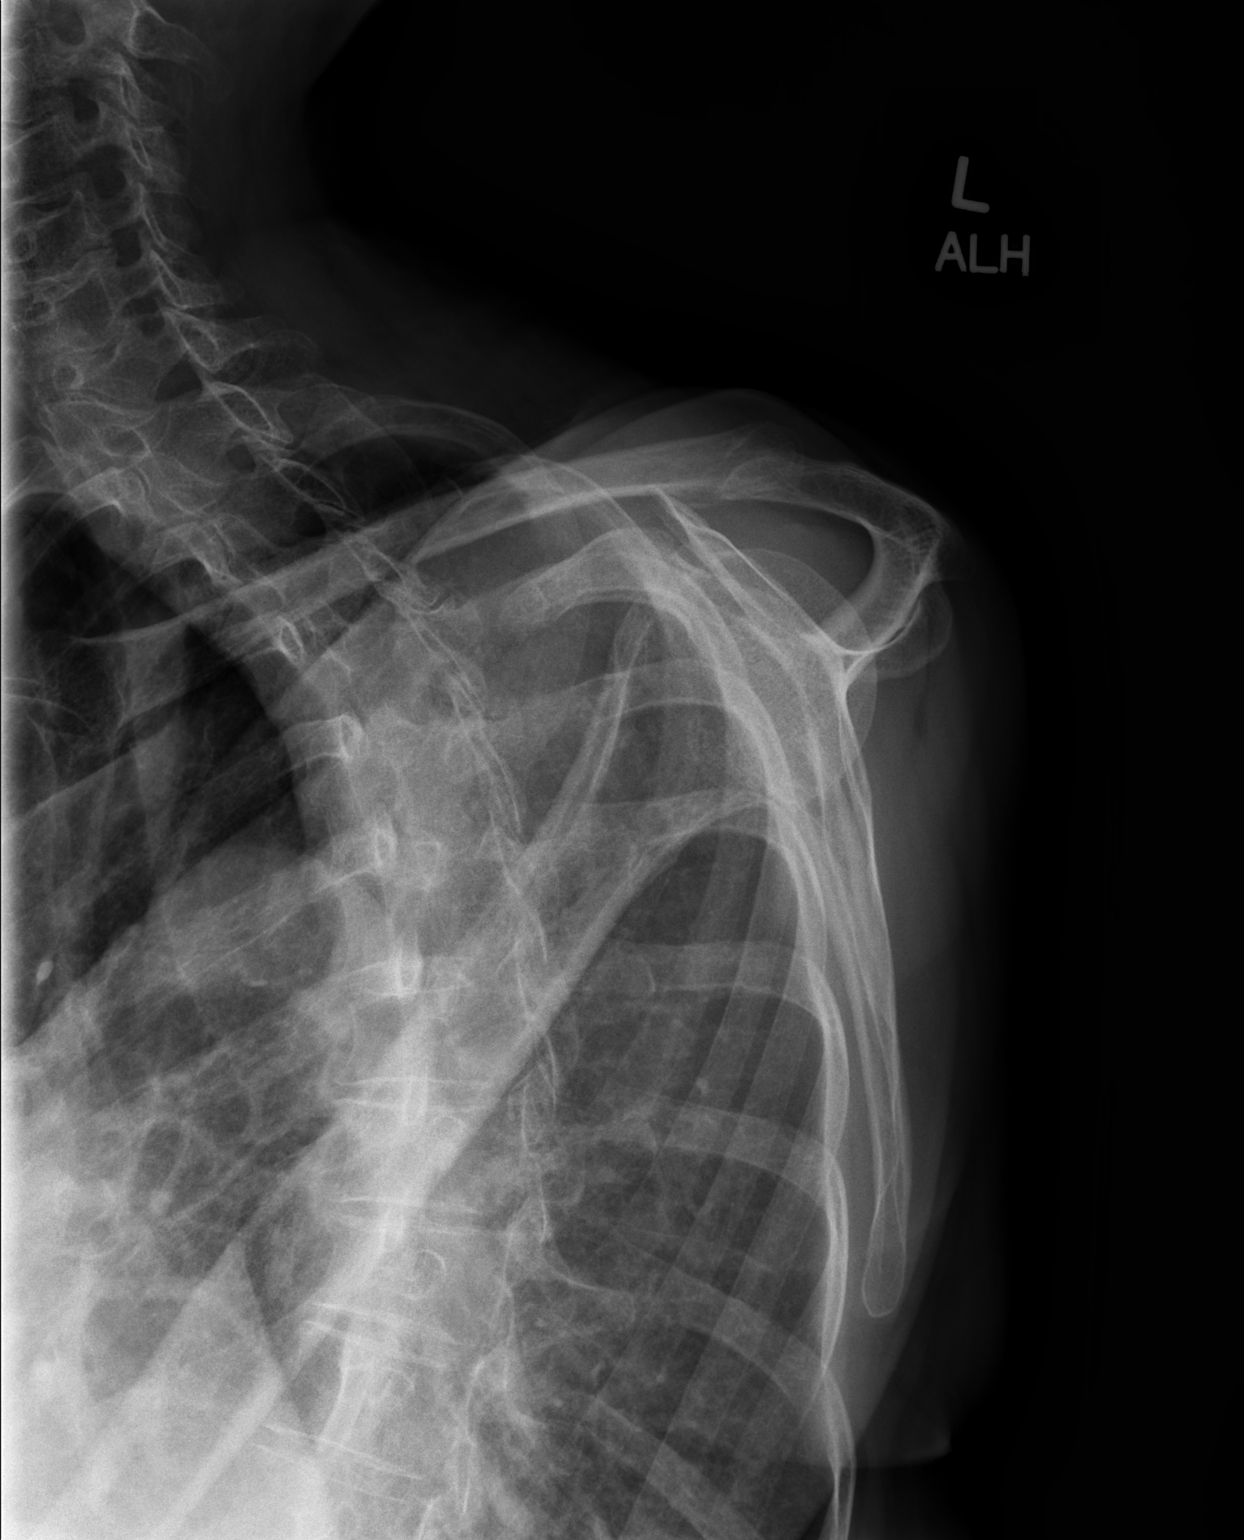

[x shoulder axillary left]
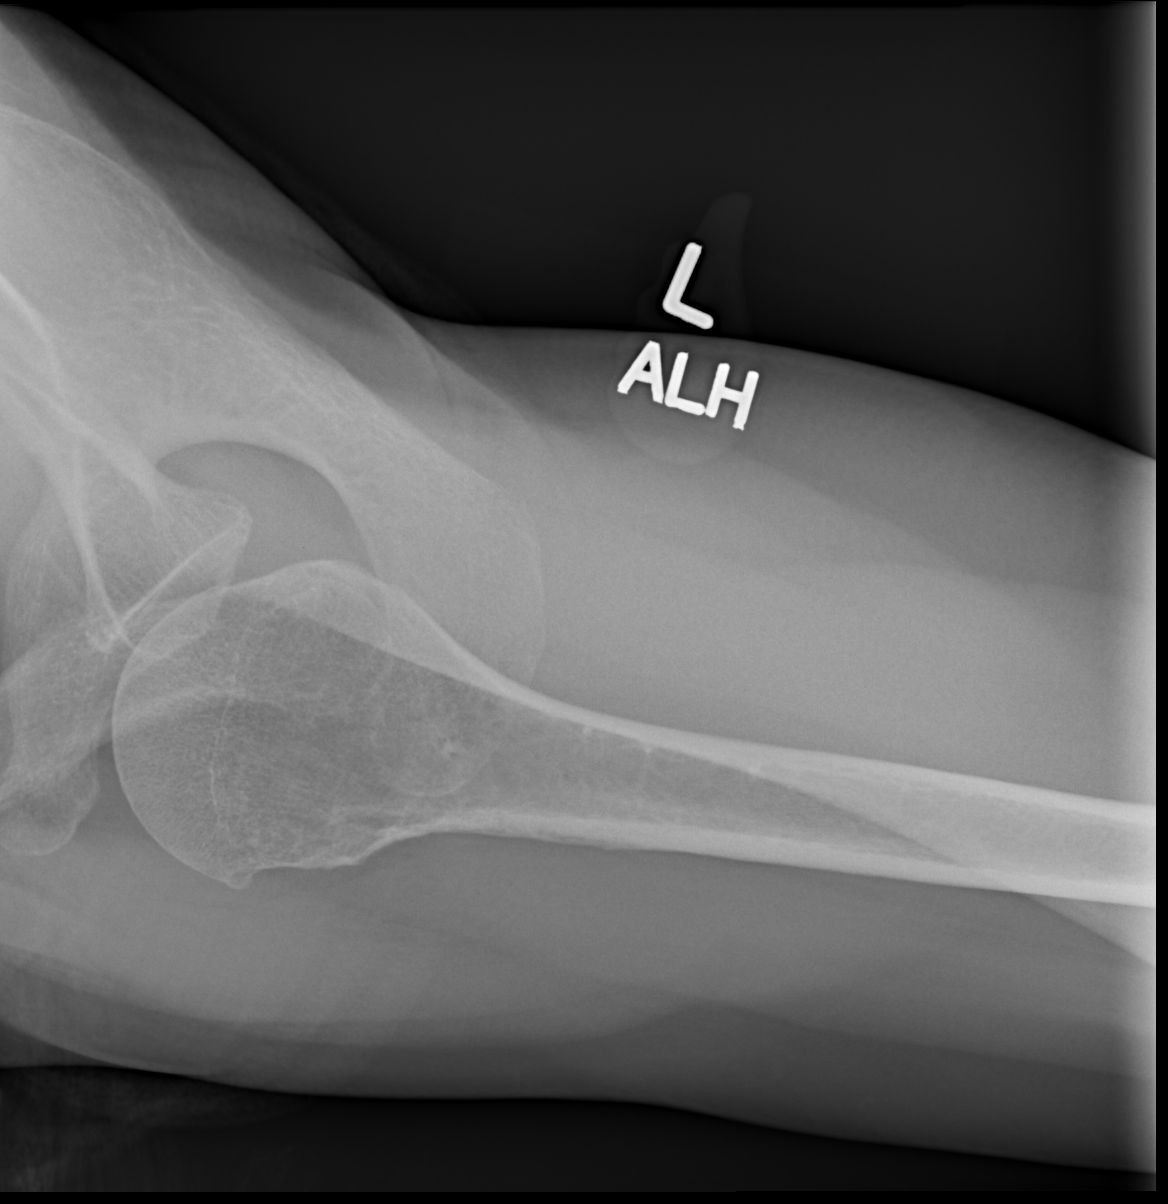

[3 of 3 positions shown; findings below may reference images not displayed]

FINDINGS: There is no evidence of fracture or dislocation. Very mild
glenohumeral and AC joint degenerative change. Soft tissues are
unremarkable. Thoracic spondylosis. Aortic atherosclerosis.
Visualized portion of the left lung are clear.
IMPRESSION: No acute fracture or dislocation of the left shoulder. Very mild
glenohumeral and AC joint degenerative change.
